# Patient Record
Sex: Female | Born: 1950 | Race: White | Hispanic: No | Smoking: Current every day smoker
Health system: Southern US, Community
[De-identification: ages and names within clinical notes are randomized; demographics above are authoritative.]

## PROBLEM LIST (undated history)

## (undated) DIAGNOSIS — M810 Age-related osteoporosis without current pathological fracture: Secondary | ICD-10-CM

## (undated) DIAGNOSIS — Z8719 Personal history of other diseases of the digestive system: Secondary | ICD-10-CM

## (undated) DIAGNOSIS — T7840XA Allergy, unspecified, initial encounter: Secondary | ICD-10-CM

## (undated) DIAGNOSIS — R87629 Unspecified abnormal cytological findings in specimens from vagina: Secondary | ICD-10-CM

## (undated) DIAGNOSIS — D126 Benign neoplasm of colon, unspecified: Secondary | ICD-10-CM

## (undated) HISTORY — DX: Unspecified abnormal cytological findings in specimens from vagina: R87.629

## (undated) HISTORY — DX: Age-related osteoporosis without current pathological fracture: M81.0

## (undated) HISTORY — PX: CERVIX SURGERY: SHX593

## (undated) HISTORY — PX: KNEE SURGERY: SHX244

## (undated) HISTORY — DX: Personal history of other diseases of the digestive system: Z87.19

## (undated) HISTORY — DX: Allergy, unspecified, initial encounter: T78.40XA

---

## 1979-08-15 DIAGNOSIS — R87629 Unspecified abnormal cytological findings in specimens from vagina: Secondary | ICD-10-CM

## 1979-08-15 HISTORY — DX: Unspecified abnormal cytological findings in specimens from vagina: R87.629

## 1998-08-07 ENCOUNTER — Other Ambulatory Visit: Admission: RE | Admit: 1998-08-07 | Discharge: 1998-08-07 | Payer: Self-pay | Admitting: *Deleted

## 1999-09-24 ENCOUNTER — Other Ambulatory Visit: Admission: RE | Admit: 1999-09-24 | Discharge: 1999-09-24 | Payer: Self-pay | Admitting: *Deleted

## 2001-08-29 ENCOUNTER — Other Ambulatory Visit: Admission: RE | Admit: 2001-08-29 | Discharge: 2001-08-29 | Payer: Self-pay | Admitting: *Deleted

## 2006-06-10 ENCOUNTER — Ambulatory Visit: Payer: Self-pay | Admitting: Gastroenterology

## 2010-07-23 ENCOUNTER — Ambulatory Visit: Payer: Self-pay | Admitting: Family Medicine

## 2014-12-25 ENCOUNTER — Ambulatory Visit: Payer: Self-pay | Admitting: Family Medicine

## 2015-07-15 DIAGNOSIS — M810 Age-related osteoporosis without current pathological fracture: Secondary | ICD-10-CM | POA: Insufficient documentation

## 2015-07-15 DIAGNOSIS — T7840XA Allergy, unspecified, initial encounter: Secondary | ICD-10-CM | POA: Insufficient documentation

## 2015-07-16 ENCOUNTER — Ambulatory Visit (INDEPENDENT_AMBULATORY_CARE_PROVIDER_SITE_OTHER): Payer: BC Managed Care – PPO | Admitting: Unknown Physician Specialty

## 2015-07-16 ENCOUNTER — Encounter: Payer: Self-pay | Admitting: Unknown Physician Specialty

## 2015-07-16 VITALS — BP 100/67 | HR 76 | Temp 98.6°F | Ht 66.1 in | Wt 194.0 lb

## 2015-07-16 DIAGNOSIS — M7581 Other shoulder lesions, right shoulder: Secondary | ICD-10-CM | POA: Diagnosis not present

## 2015-07-16 NOTE — Progress Notes (Signed)
Pleasant 64 year old female presents with right shoulder pain over last three weeks. Patient reports repetitive overhead motion while painting her deck. Pain with motion began shortly following. She reports pain at 2 on 0-10 scale. Denies treating pain except for occasional Advil with minimal relief. Fell 4 months ago on outstretched right arm and wonder if it's related.  BP 100/67 mmHg  Pulse 76  Temp(Src) 98.6 F (37 C)  Ht 5' 6.1" (1.679 m)  Wt 194 lb (87.998 kg)  BMI 31.22 kg/m2  SpO2 98%   Subjective:    Patient ID: Courtney Mills, female    DOB: June 15, 1951, 64 y.o.   MRN: [id number]  HPI: Courtney Mills is a 64 y.o. female  Chief Complaint  Patient presents with  . Arm Pain    right arm, fall back in March    Relevant past medical, surgical, family and social Shoulder Pain  The pain is present in the right shoulder. This is a recurrent problem. The current episode started 1 to 4 weeks ago. There has been no history of extremity trauma. The problem occurs constantly. The problem has been gradually worsening. The quality of the pain is described as aching. The pain is at a severity of 2/10. The pain is mild. Associated symptoms include a limited range of motion. The symptoms are aggravated by activity. She has tried OTC ointments, OTC pain meds and rest for the symptoms. The treatment provided mild relief.    history reviewed and updated as indicated. Interim medical history since our last visit reviewed. Allergies and medications reviewed and updated.    Review of Systems  Constitutional: Negative.   Respiratory: Negative.  Negative for cough, chest tightness, shortness of breath, wheezing and stridor.   Cardiovascular: Negative.  Negative for palpitations and leg swelling.  Skin: Negative.  Negative for color change, pallor, rash and wound.  Neurological: Negative.  Negative for dizziness.    Per HPI unless specifically indicated above     Objective:    BP 100/67  mmHg  Pulse 76  Temp(Src) 98.6 F (37 C)  Ht 5' 6.1" (1.679 m)  Wt 194 lb (87.998 kg)  BMI 31.22 kg/m2  SpO2 98%  Wt Readings from Last 3 Encounters:  07/16/15 194 lb (87.998 kg)  09/24/14 204 lb (92.534 kg)    Physical Exam  Constitutional: She is oriented to person, place, and time. She appears well-developed and well-nourished.  HENT:  Head: Normocephalic and atraumatic.  Neck: Normal range of motion.  Cardiovascular: Normal rate, regular rhythm and normal heart sounds.  Exam reveals no gallop and no friction rub.   No murmur heard. Pulmonary/Chest: Effort normal and breath sounds normal. No respiratory distress. She has no wheezes. She has no rales. She exhibits no tenderness.  Musculoskeletal:       Right shoulder: She exhibits decreased range of motion and pain.       Arms: Neurological: She is alert and oriented to person, place, and time.  Skin: Skin is warm.  Psychiatric: She has a normal mood and affect. Her behavior is normal. Judgment and thought content normal.    No results found for this or any previous visit.    Assessment & Plan:   Problem List Items Addressed This Visit    None    Visit Diagnoses    Rotator cuff tendonitis, right    -  Primary    Relevant Orders    Ambulatory referral to Physical Therapy  Continue OTC Advil.  Declined prescription medications.    Follow up plan: Patient referred to physical therapy and follow up PRN.

## 2015-08-14 ENCOUNTER — Ambulatory Visit (INDEPENDENT_AMBULATORY_CARE_PROVIDER_SITE_OTHER): Payer: BC Managed Care – PPO | Admitting: Unknown Physician Specialty

## 2015-08-14 ENCOUNTER — Encounter: Payer: Self-pay | Admitting: Unknown Physician Specialty

## 2015-08-14 VITALS — BP 109/72 | HR 85 | Temp 98.2°F | Ht 66.1 in | Wt 193.0 lb

## 2015-08-14 DIAGNOSIS — M7551 Bursitis of right shoulder: Secondary | ICD-10-CM | POA: Diagnosis not present

## 2015-08-14 MED ORDER — TRAMADOL HCL 50 MG PO TABS: 50.0000 mg | ORAL_TABLET | Freq: Three times a day (TID) | ORAL | Status: AC | PRN

## 2015-08-14 MED ORDER — CYCLOBENZAPRINE HCL 10 MG PO TABS: 10.0000 mg | ORAL_TABLET | Freq: Three times a day (TID) | ORAL | Status: AC | PRN

## 2015-08-14 NOTE — Progress Notes (Signed)
   BP 109/72 mmHg  Pulse 85  Temp(Src) 98.2 F (36.8 C)  Ht 5' 6.1" (1.679 m)  Wt 193 lb (87.544 kg)  BMI 31.05 kg/m2  SpO2 98%   Subjective:    Patient ID: Courtney Mills, female    DOB: 01-28-1951, 64 y.o.   MRN: [id number]  HPI: Courtney Mills is a 64 y.o. female   Right Shoulder Pain Presents for follow-up of right shoulder and elbow pain onset 5 weeks ago. Pain has progressed to entire right arm. She denies any history of injury. The quality of the pain is occasional throbbing at times and a constant ache. The pain is at a 2/10.  Aggravating factors include activity.  Relieving factors hot shower and heating pads. Treating pain with occasional 800 mg Advil q8hrs with minimal relief.  Completed physical therapy 3 days ago pain is worsening with limited range of motion.  Tension Headache Presents with c/o headache onset 2 days ago.  Constant moderate bilateral throbbing pain with occasional nausea.  Nothing relieves the symptoms.  Pertinent negatives denies photophobia, confusion, or fever.     Chief Complaint  Patient presents with  . Arm Pain    pt states she sprained her right wrist back in March, but is better now. Right shoulder and elbow started hurting recently. States muscles are very tense    Relevant past medical, surgical, family and social history reviewed and updated as indicated. Interim medical history since our last visit reviewed. Allergies and medications reviewed and updated.  Review of Systems  Constitutional: Negative for chills, activity change and appetite change.  Eyes: Negative for photophobia and visual disturbance.  Respiratory: Negative for chest tightness and shortness of breath.   Cardiovascular: Negative for chest pain, palpitations and leg swelling.  Neurological: Positive for headaches. Negative for dizziness, tremors, syncope, speech difficulty and light-headedness.       Numbness and tingling right 3rd, 4th, 5th digits of hand    Per  HPI unless specifically indicated above     Objective:    BP 109/72 mmHg  Pulse 85  Temp(Src) 98.2 F (36.8 C)  Ht 5' 6.1" (1.679 m)  Wt 193 lb (87.544 kg)  BMI 31.05 kg/m2  SpO2 98%  Wt Readings from Last 3 Encounters:  08/14/15 193 lb (87.544 kg)  07/16/15 194 lb (87.998 kg)  09/24/14 204 lb (92.534 kg)    Physical Exam  Constitutional: She appears well-nourished. No distress.  HENT:  Head: Normocephalic and atraumatic.  Right Ear: External ear normal.  Left Ear: External ear normal.  Nose: Nose normal.  Mouth/Throat: Oropharynx is clear and moist.  Eyes: Right eye exhibits no discharge. Left eye exhibits no discharge. No scleral icterus.  Neck: Normal range of motion.  Cardiovascular: Normal rate, regular rhythm and normal heart sounds.   Pulmonary/Chest: Effort normal and breath sounds normal.  Musculoskeletal:       Right shoulder: She exhibits decreased range of motion, tenderness and bony tenderness. She exhibits no swelling, no effusion and no crepitus.  Skin: Skin is warm, dry and intact. She is not diaphoretic.    No results found for this or any previous visit.    Assessment & Plan:    Shoulder bursitis Component to muscle spasm plus inpingement syndrome.  Sinc pt is BC/BS unable to inject shoulder.  Will refer to Orthopedics.  Rx for Flexeril and Tramadol from this office.    Follow up plan: With Orthopedics.

## 2015-08-14 NOTE — Assessment & Plan Note (Signed)
Orthopedic Referral Prescribed Cyclobenazaprine 10 mg tablet 3 times daily as needed for muscle spasms Prescribed Tramadol 50 mg tablet q8hrs as needed for pain

## 2015-09-04 ENCOUNTER — Other Ambulatory Visit: Payer: Self-pay | Admitting: Orthopedic Surgery

## 2015-09-04 DIAGNOSIS — M25511 Pain in right shoulder: Secondary | ICD-10-CM

## 2015-09-10 ENCOUNTER — Other Ambulatory Visit: Payer: Self-pay | Admitting: Unknown Physician Specialty

## 2015-09-10 ENCOUNTER — Ambulatory Visit
Admission: RE | Admit: 2015-09-10 | Discharge: 2015-09-10 | Disposition: A | Discharge status: Home / Self Care | Payer: BC Managed Care – PPO | Source: Ambulatory Visit | Attending: Orthopedic Surgery | Admitting: Orthopedic Surgery

## 2015-09-10 DIAGNOSIS — M25511 Pain in right shoulder: Secondary | ICD-10-CM | POA: Diagnosis present

## 2015-09-10 DIAGNOSIS — M7591 Shoulder lesion, unspecified, right shoulder: Secondary | ICD-10-CM | POA: Diagnosis not present

## 2016-02-02 IMAGING — MR MR SHOULDER*R* W/CM
5 series · 40 of 40 positions shown · IV contrast (agent unspecified)
Comparison: None.

CLINICAL DATA: Fell in February 2015. Persistent shoulder pain since
then.

EXAM:
MR ARTHROGRAM OF THE right SHOULDER
TECHNIQUE: Multiplanar, multisequence MR imaging of the right shoulder was
performed following the administration of intra-articular contrast.
CONTRAST:  See Injection Documentation.

[Series 3: T1 fat-sat · axial · 4.0mm · 0.47mm/px · z∈[-25,+72]mm · 8 of 23 slices shown (1 of 3)]
[im 1/23]
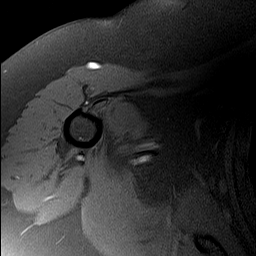
[im 4/23]
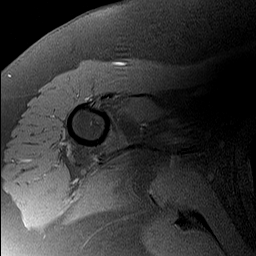
[im 7/23]
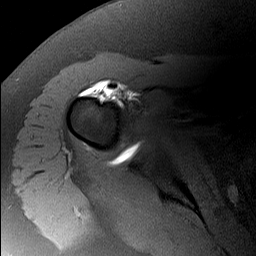
[im 10/23]
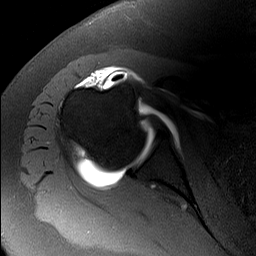
[im 13/23]
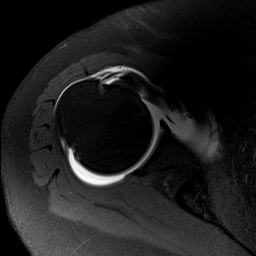
[im 16/23]
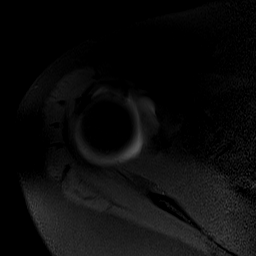
[im 19/23]
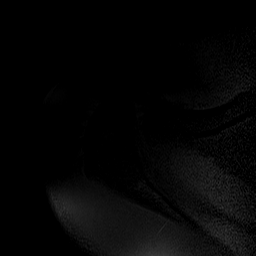
[im 23/23]
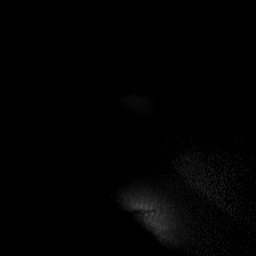

[Series 4: T1 fat-sat · oblique · 4.0mm · 0.62mm/px · 8 of 21 slices shown (2 of 3)]
[im 1/21]
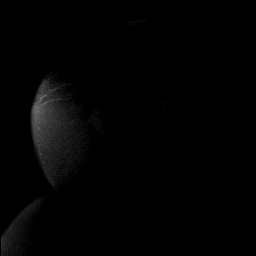
[im 3/21]
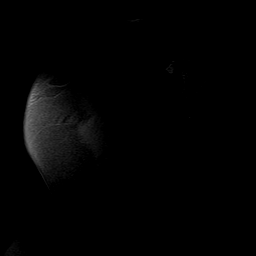
[im 6/21]
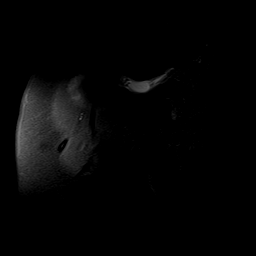
[im 9/21]
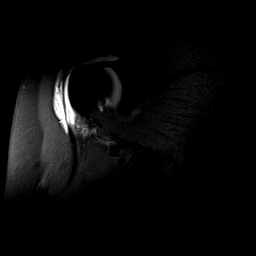
[im 12/21]
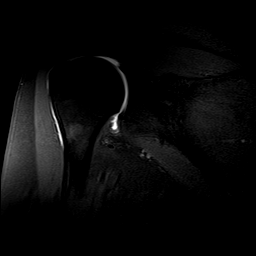
[im 15/21]
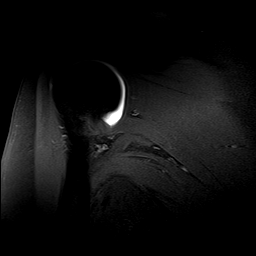
[im 18/21]
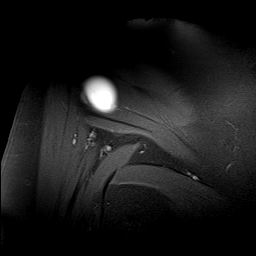
[im 21/21]
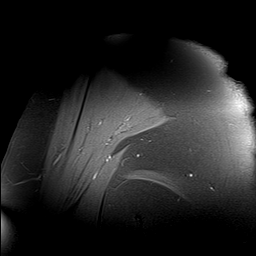

[Series 5: T2 fat-sat · oblique · 4.0mm · 0.62mm/px · 8 of 21 slices shown (1 of 2)]
[im 1/21]
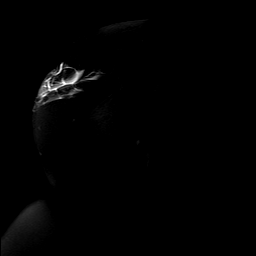
[im 3/21]
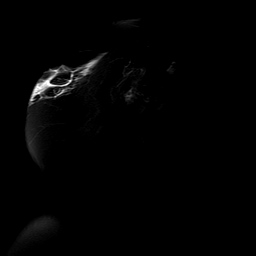
[im 6/21]
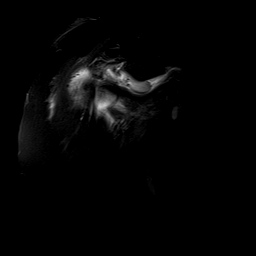
[im 9/21]
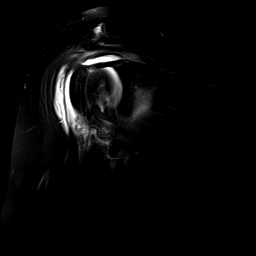
[im 12/21]
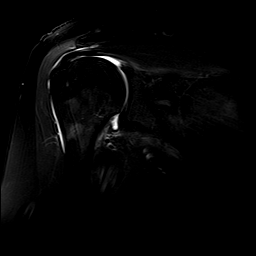
[im 15/21]
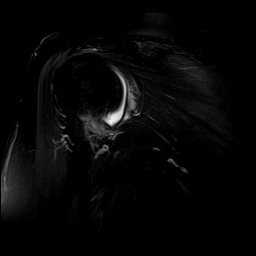
[im 18/21]
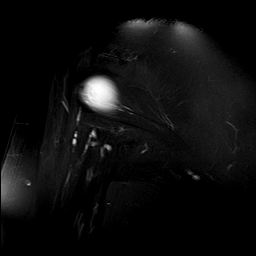
[im 21/21]
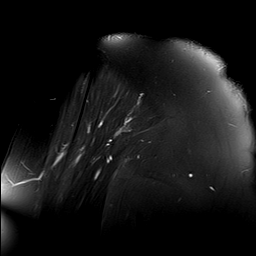

[Series 6: T1 fat-sat · oblique · non-contrast · 4.0mm · 0.42mm/px · 8 of 21 slices shown (3 of 3)]
[im 1/21]
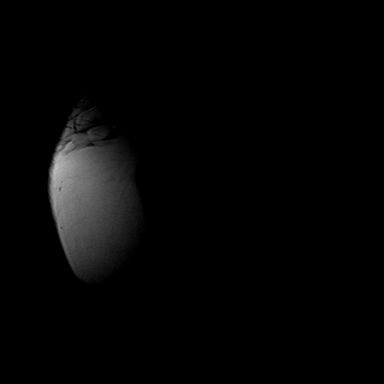
[im 3/21]
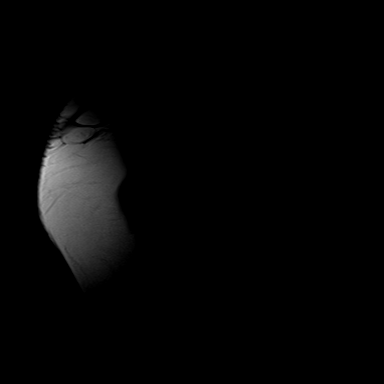
[im 6/21]
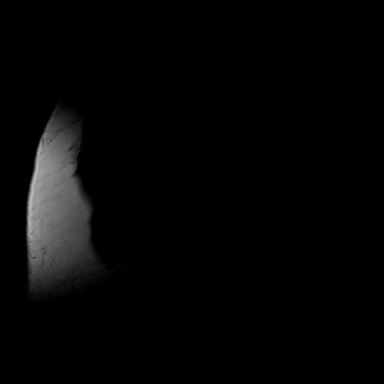
[im 9/21]
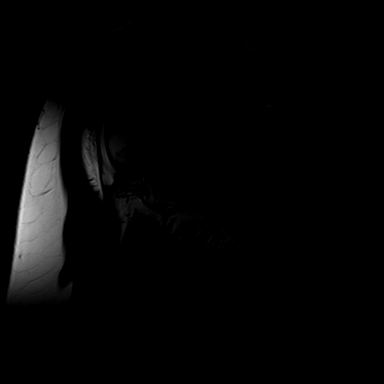
[im 12/21]
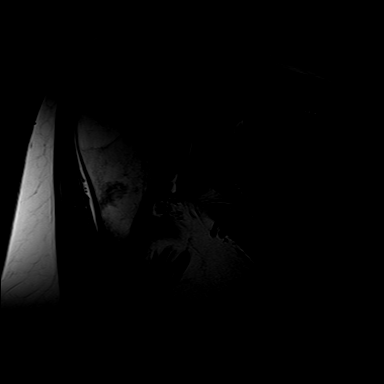
[im 15/21]
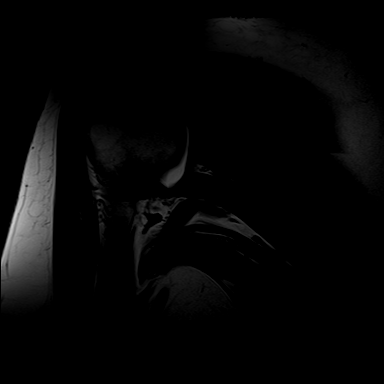
[im 18/21]
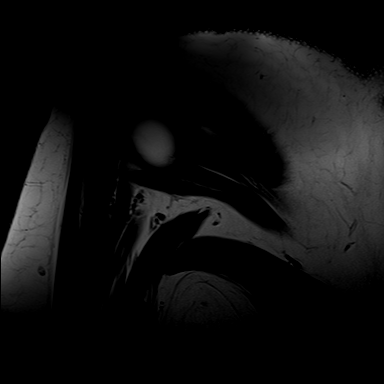
[im 21/21]
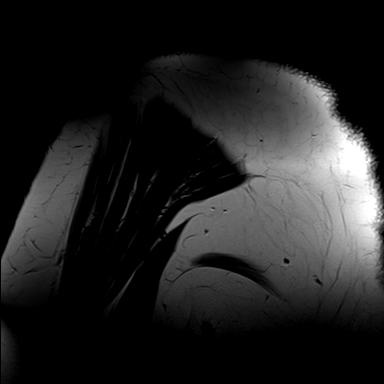

[Series 7: T2 fat-sat · oblique · 4.0mm · 0.62mm/px · 8 of 23 slices shown (2 of 2)]
[im 1/23]
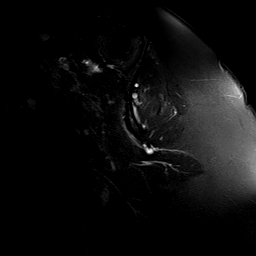
[im 4/23]
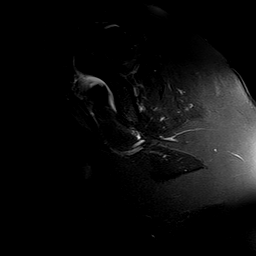
[im 7/23]
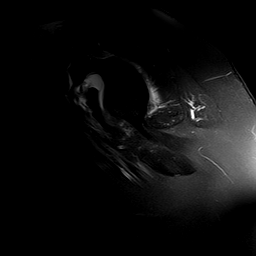
[im 10/23]
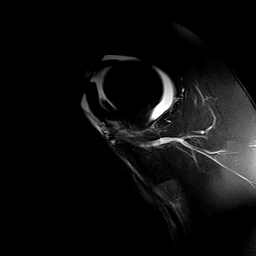
[im 13/23]
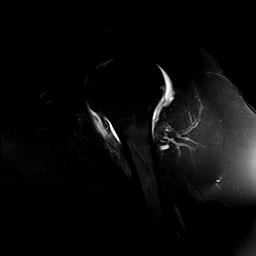
[im 16/23]
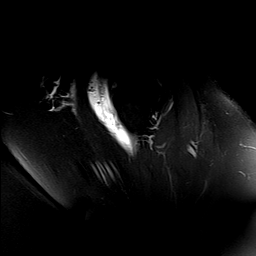
[im 19/23]
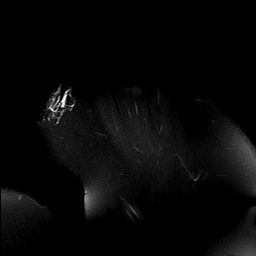
[im 23/23]
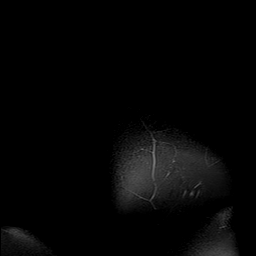

[40 of 40 positions shown; findings below may reference images not displayed]

FINDINGS: Rotator cuff: Mild rotator cuff tendinopathy/ tendinosis. No partial
or full-thickness rotator cuff tear.

Muscles: Normal.

Biceps long head: Intact.

Acromioclavicular Joint: Mild AC joint degenerative changes. The
acromion is type 2 in shape. No lateral downsloping or undersurface
spurring.

Glenohumeral Joint: No significant degenerative changes or
synovitis.

Labrum: The glenoid labrum a and glenohumeral ligaments are intact.

Bones: No acute bony findings.
IMPRESSION: 1. Mild rotator cuff tendinopathy/tendinosis. No partial or
full-thickness tear.
2. Intact long head biceps tendon and glenoid labrum.
3. No significant findings for bony impingement.

## 2016-02-02 IMAGING — RF DG ARTHROGRAM SHOULDER*R*
1 series · 3 of 3 positions shown · IV contrast (gadolinium)
Comparison: None

CLINICAL DATA: Two months of shoulder pain with limited range of
motion

EXAM:
ARTHROGRAM OF THE RIGHT SHOULDER
FLUOROSCOPY TIME:  Fluoroscopy Time (in minutes and seconds): 0
minutes, 42 seconds
Number of Acquired Images:  1
TECHNIQUE: The anticipated procedure was discussed with Ms. Soloparaclientes. She voiced
her willingness to proceed. A time-out procedure was called. There
was no known contraindication to the use of cutaneous antiseptics,
injectable lidocaine, or the iodinated and gadolinium based contrast
agents. Risks including bleeding, infection, and increased pain were
discussed and their management outlined.

[Series 1: cp_standard · 0.34mm/px · 3 of 8 frames shown]
[frame 2/8]
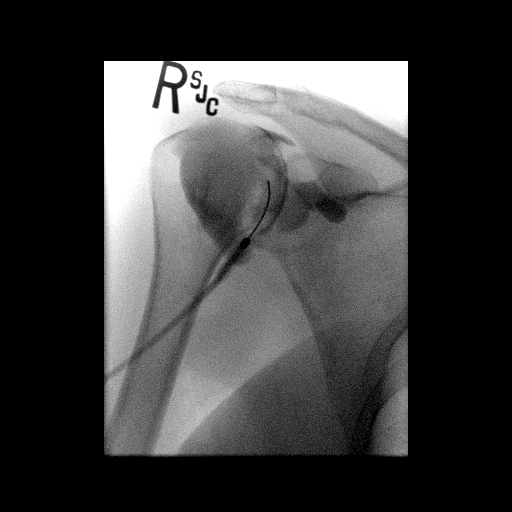
[frame 5/8]
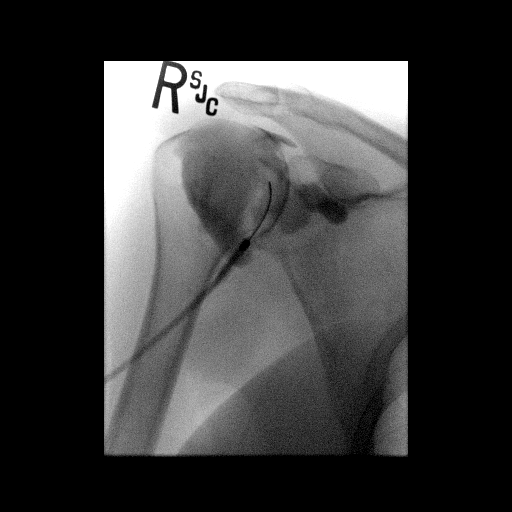
[frame 7/8]
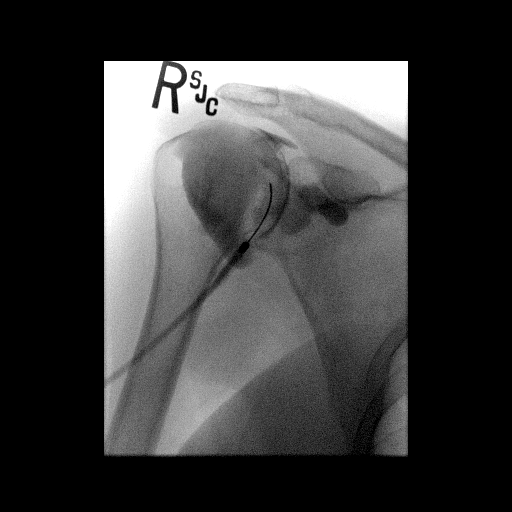

[3 of 3 positions shown; findings below may reference images not displayed]

The skin was cleansed with the antiseptic solution and the
subcutaneous tissues infiltrated with 3 cc of 1% lidocaine.
Subsequently a 22 gauge spinal needle was placed into the anterior
medial aspect of the glenohumeral joint space. A test injection was
made and subsequently a total of approximately 12 cc of a solution
of MultiHance 6.0cc, Omnipaque 200 10 cc, 1% lidocaine 3 cc, and
normal saline 15 cc was infiltrated. The needle was then withdrawn.
The patient had a moderate amount of pain during and immediately
after the procedure which then subsided.
FINDINGS: Successful injection of the the right glenohumeral joint prior to
MRI.
IMPRESSION: Successful injection of the right glenohumeral joint prior to MRI.

## 2017-04-15 ENCOUNTER — Encounter: Payer: Self-pay | Admitting: *Deleted

## 2017-04-16 ENCOUNTER — Encounter
Admission: RE | Disposition: A | Discharge status: Home / Self Care | Payer: Self-pay | Source: Ambulatory Visit | Attending: Gastroenterology

## 2017-04-16 ENCOUNTER — Ambulatory Visit: Payer: BC Managed Care – PPO | Admitting: Certified Registered Nurse Anesthetist

## 2017-04-16 ENCOUNTER — Encounter: Payer: Self-pay | Admitting: Certified Registered Nurse Anesthetist

## 2017-04-16 ENCOUNTER — Ambulatory Visit
Admission: RE | Admit: 2017-04-16 | Discharge: 2017-04-16 | Disposition: A | Discharge status: Home / Self Care | Payer: BC Managed Care – PPO | Source: Ambulatory Visit | Attending: Gastroenterology | Admitting: Gastroenterology

## 2017-04-16 DIAGNOSIS — K5730 Diverticulosis of large intestine without perforation or abscess without bleeding: Secondary | ICD-10-CM | POA: Insufficient documentation

## 2017-04-16 DIAGNOSIS — Z880 Allergy status to penicillin: Secondary | ICD-10-CM | POA: Diagnosis not present

## 2017-04-16 DIAGNOSIS — D120 Benign neoplasm of cecum: Secondary | ICD-10-CM | POA: Diagnosis not present

## 2017-04-16 DIAGNOSIS — K562 Volvulus: Secondary | ICD-10-CM | POA: Insufficient documentation

## 2017-04-16 DIAGNOSIS — M1990 Unspecified osteoarthritis, unspecified site: Secondary | ICD-10-CM | POA: Insufficient documentation

## 2017-04-16 DIAGNOSIS — D123 Benign neoplasm of transverse colon: Secondary | ICD-10-CM | POA: Insufficient documentation

## 2017-04-16 DIAGNOSIS — F17200 Nicotine dependence, unspecified, uncomplicated: Secondary | ICD-10-CM | POA: Insufficient documentation

## 2017-04-16 DIAGNOSIS — M810 Age-related osteoporosis without current pathological fracture: Secondary | ICD-10-CM | POA: Insufficient documentation

## 2017-04-16 DIAGNOSIS — Z800 Family history of malignant neoplasm of digestive organs: Secondary | ICD-10-CM | POA: Diagnosis not present

## 2017-04-16 DIAGNOSIS — D122 Benign neoplasm of ascending colon: Secondary | ICD-10-CM | POA: Diagnosis not present

## 2017-04-16 DIAGNOSIS — Z79899 Other long term (current) drug therapy: Secondary | ICD-10-CM | POA: Diagnosis not present

## 2017-04-16 DIAGNOSIS — Z1211 Encounter for screening for malignant neoplasm of colon: Secondary | ICD-10-CM | POA: Diagnosis not present

## 2017-04-16 HISTORY — PX: COLONOSCOPY WITH PROPOFOL: SHX5780

## 2017-04-16 SURGERY — COLONOSCOPY WITH PROPOFOL
Anesthesia: General

## 2017-04-16 MED ORDER — MIDAZOLAM HCL 2 MG/2ML IJ SOLN
INTRAMUSCULAR | Status: AC
  Filled 2017-04-16: qty 2

## 2017-04-16 MED ORDER — LIDOCAINE HCL (CARDIAC) 20 MG/ML IV SOLN
INTRAVENOUS | Status: DC | PRN
  Administered 2017-04-16: 60 mg via INTRAVENOUS

## 2017-04-16 MED ORDER — PHENYLEPHRINE HCL 10 MG/ML IJ SOLN
INTRAMUSCULAR | Status: DC | PRN
  Administered 2017-04-16: 200 ug via INTRAVENOUS
  Administered 2017-04-16: 100 ug via INTRAVENOUS

## 2017-04-16 MED ORDER — SODIUM CHLORIDE 0.9 % IV SOLN
INTRAVENOUS | Status: DC
  Administered 2017-04-16: 12:00:00 via INTRAVENOUS

## 2017-04-16 MED ORDER — PROPOFOL 500 MG/50ML IV EMUL
INTRAVENOUS | Status: DC | PRN
  Administered 2017-04-16: 140 ug/kg/min via INTRAVENOUS

## 2017-04-16 MED ORDER — LIDOCAINE HCL 2 % IJ SOLN
INTRAMUSCULAR | Status: AC
  Filled 2017-04-16: qty 10

## 2017-04-16 MED ORDER — PROPOFOL 500 MG/50ML IV EMUL
INTRAVENOUS | Status: AC
  Filled 2017-04-16: qty 50

## 2017-04-16 MED ORDER — SODIUM CHLORIDE 0.9 % IV SOLN: INTRAVENOUS | Status: DC

## 2017-04-16 MED ORDER — MIDAZOLAM HCL 2 MG/2ML IJ SOLN
INTRAMUSCULAR | Status: DC | PRN
  Administered 2017-04-16: 2 mg via INTRAVENOUS

## 2017-04-16 MED ORDER — PROPOFOL 10 MG/ML IV BOLUS
INTRAVENOUS | Status: DC | PRN
  Administered 2017-04-16: 30 mg via INTRAVENOUS

## 2017-04-16 MED ORDER — PROPOFOL 10 MG/ML IV BOLUS
INTRAVENOUS | Status: AC
  Filled 2017-04-16: qty 20

## 2017-04-16 MED ORDER — SODIUM CHLORIDE 0.9 % IJ SOLN
INTRAMUSCULAR | Status: DC | PRN
  Administered 2017-04-16: 10 mL

## 2017-04-16 NOTE — Anesthesia Preprocedure Evaluation (Signed)
Anesthesia Evaluation  Patient identified by MRN, date of birth, ID band Patient awake    Reviewed: Allergy & Precautions, NPO status , Patient's Chart, lab work & pertinent test results  Airway Mallampati: III  TM Distance: <3 FB     Dental  (+) Caps, Dental Advisory Given   Pulmonary Current Smoker,    Pulmonary exam normal        Cardiovascular negative cardio ROS Normal cardiovascular exam     Neuro/Psych negative neurological ROS  negative psych ROS   GI/Hepatic negative GI ROS, Neg liver ROS,   Endo/Other  negative endocrine ROS  Renal/GU negative Renal ROS  negative genitourinary   Musculoskeletal  (+) Arthritis , Osteoarthritis,    Abdominal Normal abdominal exam  (+)   Peds negative pediatric ROS (+)  Hematology negative hematology ROS (+)   Anesthesia Other Findings   Reproductive/Obstetrics                             Anesthesia Physical Anesthesia Plan  ASA: II  Anesthesia Plan: General   Post-op Pain Management:    Induction: Intravenous  Airway Management Planned: Nasal Cannula  Additional Equipment:   Intra-op Plan:   Post-operative Plan:   Informed Consent:   Dental advisory given  Plan Discussed with: CRNA and Surgeon  Anesthesia Plan Comments:         Anesthesia Quick Evaluation

## 2017-04-16 NOTE — H&P (Signed)
Outpatient short stay form Pre-procedure 04/16/2017 1:09 PM Lollie Sails MD  Primary Physician: Garen Grams NP  Reason for visit:  Colonoscopy  History of present illness:  Patient is a 66 year old female presenting today for a screening colonoscopy. There is been over 10 years since her last procedure. She tolerated her     Current Facility-Administered Medications:  .  0.9 %  sodium chloride infusion, , Intravenous, Continuous, Lollie Sails, MD, Last Rate: 20 mL/hr at 04/16/17 1229 .  0.9 %  sodium chloride infusion, , Intravenous, Continuous, Lollie Sails, MD  Prescriptions Prior to Admission  Medication Sig Dispense Refill Last Dose  . celecoxib (CELEBREX) 100 MG capsule Take 100 mg by mouth 2 (two) times daily.   12/17/2016  . cyclobenzaprine (FLEXERIL) 10 MG tablet Take 1 tablet (10 mg total) by mouth 3 (three) times daily as needed for muscle spasms. 30 tablet 0 04/16/2017 at Unknown time  . traMADol (ULTRAM) 50 MG tablet Take 1 tablet (50 mg total) by mouth every 8 (eight) hours as needed. 30 tablet 0 12/17/2016     Allergies  Allergen Reactions  . Benadryl [Diphenhydramine] Other (See Comments)    Heart races  . Codeine   . Penicillins      Past Medical History:  Diagnosis Date  . 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 Allergy   . History of IBS   . Osteoporosis     Review of systems:      Physical Exam    Heart and lungs: Regular rate and rhythm without rub or gallop, lungs are bilaterally clear.    HEENT: Normocephalic atraumatic eyes are anicteric    Other:     Pertinant exam for procedure: Soft nontender nondistended bowel sounds positive normoactive.    Planned proceedures: Colonoscopy and indicated procedures. I have discussed the risks benefits and complications of procedures to include not limited to bleeding, infection, perforation and the risk of sedation and the patient wishes to proceed.    Lollie Sails,  MD Gastroenterology 04/16/2017  1:09 PM

## 2017-04-16 NOTE — Op Note (Signed)
[facility] Gastroenterology Patient Name: Courtney Mills Procedure Date: 04/16/2017 1:10 PM MRN: [id number] Account #: [id number] Date of Birth: [birth year] Admit Type: Outpatient Age: 66 Room: [facility] ENDO ROOM 3 Gender: Female Note Status: Finalized Procedure:            Colonoscopy Indications:          Clinically significant diarrhea of unexplained origin,                        Family history of colon cancer in a first-degree                        relative Providers:            Lollie Sails, MD Referring MD:         No Local Md, MD (Referring MD) Medicines:            Monitored Anesthesia Care Complications:        No immediate complications. Procedure:            Pre-Anesthesia Assessment:                       - ASA Grade Assessment: II - A patient with mild                        systemic disease.                       After obtaining informed consent, the colonoscope was                        passed under direct vision. Throughout the procedure,                        the patient's blood pressure, pulse, and oxygen                        saturations were monitored continuously. The                        Colonoscope was introduced through the anus and                        advanced to the the cecum, identified by appendiceal                        orifice and ileocecal valve. The quality of the bowel                        preparation was good. Findings:      The descending colon, transverse colon, hepatic flexure and ascending       colon were significantly redundant.      A 4 mm polyp was found in the hepatic flexure. The polyp was sessile.       The polyp was removed with a cold snare. Resection and retrieval were       complete.      A 40x5 mm polyp, geographic in shape, was found in the cecum. The polyp       was sessile. Hemostasis was good. The polyp was removed with a cold       snare.  The polyp was removed with a saline injection-lift  technique       using a cold snare. The polyp was removed with a piecemeal technique       using a cold snare and cold forcep. Resection and retrieval were       complete.      A 3 mm polyp was found in the ascending colon. The polyp was sessile.       The polyp was removed with a cold biopsy forceps. Resection and       retrieval were complete.      Biopsies for histology were taken with a cold forceps from the right       colon and left colon for evaluation of microscopic colitis.      Multiple small-mouthed diverticula were found in the sigmoid colon and       distal descending colon.      The digital rectal exam was normal. Impression:           - Redundant colon.                       - One 4 mm polyp at the hepatic flexure, removed with a                        cold snare. Resected and retrieved.                       - One 40 mm polyp in the cecum, removed with a cold                        snare, removed using injection-lift and a cold snare                        and removed piecemeal using a cold snare. Resected and                        retrieved.                       - One 3 mm polyp in the ascending colon, removed with a                        cold biopsy forceps. Resected and retrieved.                       - Diverticulosis in the sigmoid colon and in the distal                        descending colon.                       - Biopsies were taken with a cold forceps from the                        right colon and left colon for evaluation of                        microscopic colitis. Recommendation:       - Clear liquid diet today.                       -  Full liquid diet for 2 days, then advance as                        tolerated to soft diet for 3 days. Procedure Code(s):    --- Professional ---                       [phone], Colonoscopy, flexible; with removal of tumor(s),                        polyp(s), or other lesion(s) by snare technique                        45381, Colonoscopy, flexible; with directed submucosal                        injection(s), any substance                       44034, 6, Colonoscopy, flexible; with biopsy, single                        or multiple Diagnosis Code(s):    --- Professional ---                       D12.3, Benign neoplasm of transverse colon (hepatic                        flexure or splenic flexure)                       D12.0, Benign neoplasm of cecum                       D12.2, Benign neoplasm of ascending colon                       R19.7, Diarrhea, unspecified                       Z80.0, Family history of malignant neoplasm of                        digestive organs                       K57.30, Diverticulosis of large intestine without                        perforation or abscess without bleeding                       Q43.8, Other specified congenital malformations of                        intestine CPT copyright 2016 American Medical [employer]. All rights reserved. The codes documented in this report are preliminary and upon coder review may  be revised to meet current compliance requirements. Lollie Sails, MD 04/16/2017 2:45:55 PM This report has been signed electronically. Number of Addenda: 0 Note Initiated On: 04/16/2017 1:10 PM Scope Withdrawal Time: 0 hours 46 minutes 55 seconds  Total Procedure Duration: 1 hour 16 minutes 53 seconds       [name]  [facility]

## 2017-04-16 NOTE — Transfer of Care (Signed)
Immediate Anesthesia Transfer of Care Note  Patient: Courtney Mills  Procedure(s) Performed: Procedure(s): COLONOSCOPY WITH PROPOFOL (N/A)  Patient Location: PACU  Anesthesia Type:General  Level of Consciousness: sedated  Airway & Oxygen Therapy: Patient Spontanous Breathing and Patient connected to nasal cannula oxygen  Post-op Assessment: Report given to RN and Post -op Vital signs reviewed and stable  Post vital signs: Reviewed and stable  Last Vitals:  Vitals:   04/16/17 1211 04/16/17 1440  BP: 103/63 (!) 105/93  Pulse: 73 74  Resp: 16 (!) 24  Temp: (!) 36.1 C 36.4 C    Last Pain:  Vitals:   04/16/17 1440  TempSrc: Tympanic  PainSc: Asleep         Complications: No apparent anesthesia complications

## 2017-04-16 NOTE — Anesthesia Post-op Follow-up Note (Cosign Needed)
Anesthesia QCDR form completed.        

## 2017-04-16 NOTE — Anesthesia Postprocedure Evaluation (Signed)
Anesthesia Post Note  Patient: Courtney Mills  Procedure(s) Performed: Procedure(s) (LRB): COLONOSCOPY WITH PROPOFOL (N/A)  Patient location during evaluation: Endoscopy Anesthesia Type: General Level of consciousness: awake and alert Pain management: pain level controlled Vital Signs Assessment: post-procedure vital signs reviewed and stable Respiratory status: spontaneous breathing, nonlabored ventilation, respiratory function stable and patient connected to nasal cannula oxygen Cardiovascular status: blood pressure returned to baseline and stable Postop Assessment: no signs of nausea or vomiting Anesthetic complications: no     Last Vitals:  Vitals:   04/16/17 1500 04/16/17 1510  BP: 92/68 100/68  Pulse: 61 61  Resp: 18 (!) 22  Temp:      Last Pain:  Vitals:   04/16/17 1440  TempSrc: Tympanic  PainSc: Asleep                 Precious Haws Piscitello

## 2017-04-16 NOTE — Anesthesia Procedure Notes (Signed)
Date/Time: 04/16/2017 1:09 PM Performed by: Johnna Acosta Pre-anesthesia Checklist: Patient identified, Emergency Drugs available, Suction available, Patient being monitored and Timeout performed Patient Re-evaluated:Patient Re-evaluated prior to inductionOxygen Delivery Method: Nasal cannula

## 2017-04-19 ENCOUNTER — Encounter: Payer: Self-pay | Admitting: Gastroenterology

## 2017-04-20 LAB — SURGICAL PATHOLOGY

## 2021-10-08 ENCOUNTER — Encounter: Payer: Self-pay | Admitting: *Deleted

## 2021-10-09 ENCOUNTER — Encounter: Payer: Self-pay | Admitting: *Deleted

## 2021-10-09 ENCOUNTER — Ambulatory Visit: Payer: Medicare PPO | Admitting: Anesthesiology

## 2021-10-09 ENCOUNTER — Encounter
Admission: RE | Disposition: A | Discharge status: Home / Self Care | Payer: Self-pay | Source: Home / Self Care | Attending: Gastroenterology

## 2021-10-09 ENCOUNTER — Ambulatory Visit
Admission: RE | Admit: 2021-10-09 | Discharge: 2021-10-09 | Disposition: A | Discharge status: Home / Self Care | Payer: Medicare PPO | Attending: Gastroenterology | Admitting: Gastroenterology

## 2021-10-09 ENCOUNTER — Other Ambulatory Visit: Payer: Self-pay

## 2021-10-09 DIAGNOSIS — D128 Benign neoplasm of rectum: Secondary | ICD-10-CM | POA: Diagnosis not present

## 2021-10-09 DIAGNOSIS — Z888 Allergy status to other drugs, medicaments and biological substances status: Secondary | ICD-10-CM | POA: Diagnosis not present

## 2021-10-09 DIAGNOSIS — Z09 Encounter for follow-up examination after completed treatment for conditions other than malignant neoplasm: Secondary | ICD-10-CM | POA: Diagnosis present

## 2021-10-09 DIAGNOSIS — D120 Benign neoplasm of cecum: Secondary | ICD-10-CM | POA: Insufficient documentation

## 2021-10-09 DIAGNOSIS — F17200 Nicotine dependence, unspecified, uncomplicated: Secondary | ICD-10-CM | POA: Insufficient documentation

## 2021-10-09 DIAGNOSIS — Z880 Allergy status to penicillin: Secondary | ICD-10-CM | POA: Diagnosis not present

## 2021-10-09 DIAGNOSIS — Z885 Allergy status to narcotic agent status: Secondary | ICD-10-CM | POA: Diagnosis not present

## 2021-10-09 DIAGNOSIS — Q438 Other specified congenital malformations of intestine: Secondary | ICD-10-CM | POA: Diagnosis not present

## 2021-10-09 HISTORY — DX: Benign neoplasm of colon, unspecified: D12.6

## 2021-10-09 HISTORY — PX: COLONOSCOPY: SHX5424

## 2021-10-09 SURGERY — COLONOSCOPY
Anesthesia: General

## 2021-10-09 MED ORDER — EPHEDRINE SULFATE 50 MG/ML IJ SOLN
INTRAMUSCULAR | Status: DC | PRN
  Administered 2021-10-09: 10 mg via INTRAVENOUS

## 2021-10-09 MED ORDER — SODIUM CHLORIDE 0.9 % IV SOLN: INTRAVENOUS | Status: DC

## 2021-10-09 MED ORDER — PROPOFOL 500 MG/50ML IV EMUL
INTRAVENOUS | Status: DC | PRN
  Administered 2021-10-09: 120 ug/kg/min via INTRAVENOUS

## 2021-10-09 MED ORDER — PROPOFOL 10 MG/ML IV BOLUS
INTRAVENOUS | Status: AC
Start: 2021-10-09 — End: ?
  Filled 2021-10-09: qty 20

## 2021-10-09 MED ORDER — PHENYLEPHRINE HCL (PRESSORS) 10 MG/ML IV SOLN
INTRAVENOUS | Status: DC | PRN
  Administered 2021-10-09: 100 ug via INTRAVENOUS

## 2021-10-09 MED ORDER — PROPOFOL 10 MG/ML IV BOLUS
INTRAVENOUS | Status: DC | PRN
  Administered 2021-10-09 (×3): 20 mg via INTRAVENOUS
  Administered 2021-10-09: 60 mg via INTRAVENOUS

## 2021-10-09 MED ORDER — FENTANYL CITRATE (PF) 100 MCG/2ML IJ SOLN
INTRAMUSCULAR | Status: DC | PRN
  Administered 2021-10-09 (×4): 25 ug via INTRAVENOUS

## 2021-10-09 MED ORDER — PROPOFOL 500 MG/50ML IV EMUL
INTRAVENOUS | Status: AC
  Filled 2021-10-09: qty 50

## 2021-10-09 MED ORDER — FENTANYL CITRATE (PF) 100 MCG/2ML IJ SOLN
INTRAMUSCULAR | Status: AC
  Filled 2021-10-09: qty 2

## 2021-10-09 NOTE — Op Note (Signed)
[facility] Gastroenterology Patient Name: Courtney Mills Procedure Date: 10/09/2021 10:58 AM MRN: [id number] Account #: [id number] Date of Birth: [birth year] Admit Type: Outpatient Age: 70 Room: [facility] ENDO ROOM 1 Gender: Female Note Status: Finalized Instrument Name: Colonoscope [id number] Procedure:             Colonoscopy Indications:           High risk colon cancer surveillance: Personal history                         of colonic polyps Providers:             Rueben Bash, DO Referring MD:          Kathrine Haddock (Referring MD) Medicines:             Monitored Anesthesia Care Complications:         No immediate complications. Estimated blood loss:                         Minimal. Procedure:             Pre-Anesthesia Assessment:                        - Prior to the procedure, a History and Physical was                         performed, and patient medications and allergies were                         reviewed. The patient is competent. The risks and                         benefits of the procedure and the sedation options and                         risks were discussed with the patient. All questions                         were answered and informed consent was obtained.                         Patient identification and proposed procedure were                         verified by the physician, the nurse, the anesthetist                         and the technician in the endoscopy suite. Mental                         Status Examination: alert and oriented. Airway                         Examination: normal oropharyngeal airway and neck                         mobility. Respiratory Examination: clear to  auscultation. CV Examination: RRR, no murmurs, no S3                         or S4. Prophylactic Antibiotics: The patient does not                         require prophylactic antibiotics. Prior                          Anticoagulants: The patient has taken no previous                         anticoagulant or antiplatelet agents. ASA Grade                         Assessment: III - A patient with severe systemic                         disease. After reviewing the risks and benefits, the                         patient was deemed in satisfactory condition to                         undergo the procedure. The anesthesia plan was to use                         monitored anesthesia care (MAC). Immediately prior to                         administration of medications, the patient was                         re-assessed for adequacy to receive sedatives. The                         heart rate, respiratory rate, oxygen saturations,                         blood pressure, adequacy of pulmonary ventilation, and                         response to care were monitored throughout the                         procedure. The physical status of the patient was                         re-assessed after the procedure.                        After obtaining informed consent, the colonoscope was                         passed under direct vision. Throughout the procedure,                         the patient's blood pressure, pulse, and oxygen  saturations were monitored continuously. The                         Colonoscope was introduced through the anus and                         advanced to the the terminal ileum, with                         identification of the appendiceal orifice and IC                         valve. The colonoscopy was somewhat difficult due to a                         redundant colon. Successful completion of the                         procedure was aided by using manual pressure,                         straightening and shortening the scope to obtain bowel                         loop reduction, using scope torsion and lavage. The                         patient tolerated  the procedure well. The quality of                         the bowel preparation was evaluated using the BBPS                         [facility] Bowel Preparation Scale) with scores of: Right                         Colon = 3, Transverse Colon = 3 and Left Colon = 3                         (entire mucosa seen well with no residual staining,                         small fragments of stool or opaque liquid). The total                         BBPS score equals 9. Findings:      The perianal and digital rectal examinations were normal. Pertinent       negatives include normal sphincter tone.      The terminal ileum appeared normal.      A 2 to 3 mm polyp was found in the rectum. The polyp was sessile. The       polyp was removed with a cold biopsy forceps. Resection and retrieval       were complete. Estimated blood loss was minimal.      A residual polypoid area was found from previous polypectomy polyp was       found in the cecum. The was sessile. The area  was islands of polypoid       mucosa. A cold snare and jump forceps were used to help remove these       polypoid areas. Due to abdominal breathing, this made this removal quite       difficult. The entire area was not able to be resected. Retrieval was       accomplished of the pieces removed.      The exam was otherwise without abnormality on direct and retroflexion       views. Impression:            - The examined portion of the ileum was normal.                        - One 2 to 3 mm polyp in the rectum, removed with a                         cold biopsy forceps. Resected and retrieved.                        - One residual polypoid area was found from previous                         polypectomy polyp in the cecum.                        - The examination was otherwise normal on direct and                         retroflexion views. Recommendation:        - Discharge patient to home.                        - Resume previous diet.                         - Continue present medications.                        - Await pathology results.                        - Repeat colonoscopy in 6 months for retreatment.                        - Return to referring physician as previously                         scheduled. Procedure Code(s):     --- Professional ---                        [phone], Colonoscopy, flexible; with biopsy, single or                         multiple Diagnosis Code(s):     --- Professional ---                        Z86.010, Personal history of colonic polyps  K62.1, Rectal polyp                        K63.5, Polyp of colon CPT copyright 2019 [employer]. All rights reserved. The codes documented in this report are preliminary and upon coder review may  be revised to meet current compliance requirements. Attending Participation:      I personally performed the entire procedure. Volney American, DO [name] DO, DO 10/09/2021 12:15:28 PM This report has been signed electronically. Number of Addenda: 0 Note Initiated On: 10/09/2021 10:58 AM Scope Withdrawal Time: 0 hours 22 minutes 24 seconds  Total Procedure Duration: 0 hours 47 minutes 22 seconds  Estimated Blood Loss:  Estimated blood loss was minimal.      [facility]

## 2021-10-09 NOTE — Transfer of Care (Signed)
Immediate Anesthesia Transfer of Care Note  Patient: Courtney Mills  Procedure(s) Performed: COLONOSCOPY  Patient Location: PACU  Anesthesia Type:MAC  Level of Consciousness: awake  Airway & Oxygen Therapy: Patient Spontanous Breathing  Post-op Assessment: Report given to RN and Post -op Vital signs reviewed and stable  Post vital signs: stable  Last Vitals:  Vitals Value Taken Time  BP 96/67 10/09/21 1210  Temp 35.9 C 10/09/21 1210  Pulse 105 10/09/21 1212  Resp 20 10/09/21 1212  SpO2 93 % 10/09/21 1212  Vitals shown include unvalidated device data.  Last Pain:  Vitals:   10/09/21 1210  TempSrc: Temporal  PainSc:          Complications: No notable events documented.

## 2021-10-09 NOTE — Interval H&P Note (Signed)
History and Physical Interval Note: Preprocedure H&P from 10/09/21  was reviewed and there was no interval change after seeing and examining the patient.  Written consent was obtained from the patient after discussion of risks, benefits, and alternatives. Patient has consented to proceed with Colonoscopy with possible intervention   10/09/2021 11:14 AM  Courtney Mills  has presented today for surgery, with the diagnosis of (Z86.010) History of colon polyps  (Z80.0) Family history of colon cancer.  The various methods of treatment have been discussed with the patient and family. After consideration of risks, benefits and other options for treatment, the patient has consented to  Procedure(s): COLONOSCOPY (N/A) as a surgical intervention.  The patient's history has been reviewed, patient examined, no change in status, stable for surgery.  I have reviewed the patient's chart and labs.  Questions were answered to the patient's satisfaction.     Courtney Mills

## 2021-10-09 NOTE — H&P (Signed)
[facility] Pre-Procedure H&P   Patient ID: Courtney Mills is a 70 y.o. female.  Gastroenterology Provider: [name], DO  Referring Provider: Laurine Blazer, PA PCP: Kathrine Haddock, NP  Date: 10/09/2021  HPI Ms. Courtney Mills is a 70 y.o. female who presents today for Colonoscopy for personal history of colon polyps and fhx of colon cancer.  2018 Colonoscopy with 4cm polyp in the cecum; pt did not return for f/u colonoscopy for evaluation Bx negative for microscopic colitis Positive tobacco use Soft bm qd, otherwise regular, no blood/melena. Appetite and weight stable. No other acute gi complaints.   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   Allergy    History of IBS    Osteoporosis    Tubular adenoma of colon     Past Surgical History:  Procedure Laterality Date   CERVIX SURGERY     COLONOSCOPY WITH PROPOFOL N/A 04/16/2017   Procedure: COLONOSCOPY WITH PROPOFOL;  Surgeon: Lollie Sails, MD;  Location: [facility] ENDOSCOPY;  Service: Endoscopy;  Laterality: N/A;   KNEE SURGERY Bilateral    Arthroscopic    Family History Mother- CRC - 39s Father- Crohns No other h/o GI disease or malignancy  Review of Systems  Constitutional:  Negative for activity change, appetite change, fatigue, fever and unexpected weight change.  HENT:  Negative for trouble swallowing and voice change.   Respiratory:  Negative for shortness of breath and wheezing.   Cardiovascular:  Negative for chest pain and palpitations.  Gastrointestinal:  Negative for abdominal distention, abdominal pain, anal bleeding, blood in stool, constipation, diarrhea, nausea, rectal pain and vomiting.  Musculoskeletal:  Negative for arthralgias and myalgias.  Skin:  Negative for color change and pallor.  Neurological:  Negative for dizziness, syncope and weakness.  Psychiatric/Behavioral:  Negative for confusion.   All other  systems reviewed and are negative.   Medications No current facility-administered medications on file prior to encounter.   Current Outpatient Medications on File Prior to Encounter  Medication Sig Dispense Refill   ascorbic acid (VITAMIN C) 1000 MG tablet Take 1,000 mg by mouth daily. (Patient not taking: Reported on 10/08/2021)     celecoxib (CELEBREX) 100 MG capsule Take 100 mg by mouth 2 (two) times daily. (Patient not taking: Reported on 10/09/2021)     cyclobenzaprine (FLEXERIL) 10 MG tablet Take 1 tablet (10 mg total) by mouth 3 (three) times daily as needed for muscle spasms. (Patient not taking: Reported on 10/09/2021) 30 tablet 0   traMADol (ULTRAM) 50 MG tablet Take 1 tablet (50 mg total) by mouth every 8 (eight) hours as needed. 30 tablet 0    Pertinent medications related to GI and procedure were reviewed by me with the patient prior to the procedure   Current Facility-Administered Medications:    0.9 %  sodium chloride infusion, , Intravenous, Continuous, [name], DO, Last Rate: 20 mL/hr at 10/09/21 1030, Continued from Pre-op at 10/09/21 1030  sodium chloride 20 mL/hr at 10/09/21 1030       Allergies  Allergen Reactions   Benadryl [Diphenhydramine] Other (See Comments)    Heart races   Codeine    Penicillins    Allergies were reviewed by me prior to the procedure  Objective    Vitals:   10/09/21 1004  BP: 113/72  Pulse: 70  Resp: 16  Temp: (!) 96.9 F (36.1 C)  TempSrc: Temporal  SpO2: 100%  Weight: 86.6  kg  Height: 5\' 6"  (1.676 m)     Physical Exam Vitals reviewed.  Constitutional:      General: She is not in acute distress.    Appearance: Normal appearance. She is not ill-appearing, toxic-appearing or diaphoretic.  HENT:     Head: Normocephalic and atraumatic.     Nose: Nose normal.     Mouth/Throat:     Mouth: Mucous membranes are moist.     Pharynx: Oropharynx is clear.  Eyes:     General: No scleral icterus.    Extraocular  Movements: Extraocular movements intact.  Cardiovascular:     Rate and Rhythm: Normal rate and regular rhythm.     Heart sounds: Normal heart sounds. No murmur heard.   No friction rub. No gallop.  Pulmonary:     Effort: Pulmonary effort is normal. No respiratory distress.     Breath sounds: Normal breath sounds. No wheezing, rhonchi or rales.  Abdominal:     General: Bowel sounds are normal. There is no distension.     Palpations: Abdomen is soft.     Tenderness: There is no abdominal tenderness. There is no guarding or rebound.  Musculoskeletal:     Cervical back: Neck supple.     Right lower leg: No edema.     Left lower leg: No edema.  Skin:    General: Skin is warm and dry.     Coloration: Skin is not jaundiced or pale.  Neurological:     General: No focal deficit present.     Mental Status: She is alert and oriented to person, place, and time. Mental status is at baseline.  Psychiatric:        Mood and Affect: Mood normal.        Behavior: Behavior normal.        Thought Content: Thought content normal.        Judgment: Judgment normal.     Assessment:  Ms. Courtney Mills is a 70 y.o. female  who presents today for Colonoscopy for personal history of colon polyps and fhx of colon cancer.  Plan:  Colonoscopy with possible intervention today Colonoscopy with possible biopsy, control of bleeding, polypectomy, and interventions as necessary has been discussed with the patient/patient representative. Informed consent was obtained from the patient/patient representative after explaining the indication, nature, and risks of the procedure including but not limited to death, bleeding, perforation, missed neoplasm/lesions, cardiorespiratory compromise, and reaction to medications. Opportunity for questions was given and appropriate answers were provided. Patient/patient representative has verbalized understanding is amenable to undergoing the procedure.   [name], DO   [facility]  Portions of the record may have been created with voice recognition software. Occasional wrong-word or 'sound-a-like' substitutions may have occurred due to the inherent limitations of voice recognition software.  Read the chart carefully and recognize, using context, where substitutions may have occurred.

## 2021-10-09 NOTE — Anesthesia Postprocedure Evaluation (Signed)
Anesthesia Post Note  Patient: Courtney Mills  Procedure(s) Performed: COLONOSCOPY  Patient location during evaluation: PACU Anesthesia Type: General Level of consciousness: awake and oriented Pain management: pain level controlled Vital Signs Assessment: post-procedure vital signs reviewed and stable Respiratory status: spontaneous breathing and respiratory function stable Cardiovascular status: blood pressure returned to baseline Anesthetic complications: no   No notable events documented.   Last Vitals:  Vitals:   10/09/21 1230 10/09/21 1240  BP: 98/60 102/69  Pulse: 83 96  Resp: 20 18  Temp:    SpO2: 98% 98%    Last Pain:  Vitals:   10/09/21 1210  TempSrc: Temporal  PainSc:                  VAN STAVEREN,Tianni Escamilla

## 2021-10-09 NOTE — Anesthesia Preprocedure Evaluation (Signed)
Anesthesia Evaluation  Patient identified by MRN, date of birth, ID band Patient awake    Reviewed: Allergy & Precautions, NPO status , Patient's Chart, lab work & pertinent test results  Airway Mallampati: II  TM Distance: >3 FB Neck ROM: full    Dental  (+) Teeth Intact   Pulmonary neg pulmonary ROS, COPD, Current Smoker and Patient abstained from smoking.,    Pulmonary exam normal  + decreased breath sounds      Cardiovascular Exercise Tolerance: Good negative cardio ROS Normal cardiovascular exam Rhythm:Regular Rate:Normal     Neuro/Psych negative neurological ROS  negative psych ROS   GI/Hepatic negative GI ROS, Neg liver ROS,   Endo/Other  negative endocrine ROS  Renal/GU negative Renal ROS  negative genitourinary   Musculoskeletal negative musculoskeletal ROS (+)   Abdominal (+) + obese,   Peds negative pediatric ROS (+)  Hematology negative hematology ROS (+)   Anesthesia Other Findings Past Medical History: 08/15/1979: Abnormal vaginal Pap smear     Comment:  frozen cervic No date: Allergy No date: History of IBS No date: Osteoporosis No date: Tubular adenoma of colon  Past Surgical History: No date: CERVIX SURGERY 04/16/2017: COLONOSCOPY WITH PROPOFOL; N/A     Comment:  Procedure: COLONOSCOPY WITH PROPOFOL;  Surgeon:               Lollie Sails, MD;  Location: [facility] ENDOSCOPY;                Service: Endoscopy;  Laterality: N/A; No date: KNEE SURGERY; Bilateral     Comment:  Arthroscopic  BMI    Body Mass Index: 30.83 kg/m      Reproductive/Obstetrics negative OB ROS                             Anesthesia Physical Anesthesia Plan  ASA: 3  Anesthesia Plan: General   Post-op Pain Management:    Induction: Intravenous  PONV Risk Score and Plan: Propofol infusion and TIVA  Airway Management Planned: Nasal Cannula  Additional Equipment:   Intra-op  Plan:   Post-operative Plan:   Informed Consent: I have reviewed the patients History and Physical, chart, labs and discussed the procedure including the risks, benefits and alternatives for the proposed anesthesia with the patient or authorized representative who has indicated his/her understanding and acceptance.     Dental Advisory Given  Plan Discussed with: Anesthesiologist, CRNA and Surgeon  Anesthesia Plan Comments: (Patient consented for risks of anesthesia including but not limited to:  - adverse reactions to medications - risk of airway placement if required - damage to eyes, teeth, lips or other oral mucosa - nerve damage due to positioning  - sore throat or hoarseness - Damage to heart, brain, nerves, lungs, other parts of body or loss of life  Patient voiced understanding.)        Anesthesia Quick Evaluation

## 2021-10-10 LAB — SURGICAL PATHOLOGY

## 2022-05-19 ENCOUNTER — Encounter: Payer: Self-pay | Admitting: Internal Medicine

## 2022-05-20 ENCOUNTER — Ambulatory Visit
Admission: RE | Admit: 2022-05-20 | Discharge: 2022-05-20 | Disposition: A | Discharge status: Home / Self Care | Payer: Medicare PPO | Attending: Internal Medicine | Admitting: Internal Medicine

## 2022-05-20 ENCOUNTER — Encounter
Admission: RE | Disposition: A | Discharge status: Home / Self Care | Payer: Self-pay | Source: Home / Self Care | Attending: Internal Medicine

## 2022-05-20 ENCOUNTER — Encounter: Payer: Self-pay | Admitting: Internal Medicine

## 2022-05-20 ENCOUNTER — Ambulatory Visit: Payer: Medicare PPO | Admitting: Anesthesiology

## 2022-05-20 ENCOUNTER — Other Ambulatory Visit: Payer: Self-pay

## 2022-05-20 DIAGNOSIS — Z1211 Encounter for screening for malignant neoplasm of colon: Secondary | ICD-10-CM | POA: Insufficient documentation

## 2022-05-20 DIAGNOSIS — K640 First degree hemorrhoids: Secondary | ICD-10-CM | POA: Diagnosis not present

## 2022-05-20 DIAGNOSIS — E669 Obesity, unspecified: Secondary | ICD-10-CM | POA: Insufficient documentation

## 2022-05-20 DIAGNOSIS — Z6831 Body mass index (BMI) 31.0-31.9, adult: Secondary | ICD-10-CM | POA: Insufficient documentation

## 2022-05-20 DIAGNOSIS — F17210 Nicotine dependence, cigarettes, uncomplicated: Secondary | ICD-10-CM | POA: Diagnosis not present

## 2022-05-20 DIAGNOSIS — K219 Gastro-esophageal reflux disease without esophagitis: Secondary | ICD-10-CM | POA: Diagnosis not present

## 2022-05-20 DIAGNOSIS — Z86010 Personal history of colonic polyps: Secondary | ICD-10-CM | POA: Diagnosis not present

## 2022-05-20 DIAGNOSIS — D120 Benign neoplasm of cecum: Secondary | ICD-10-CM | POA: Diagnosis not present

## 2022-05-20 HISTORY — PX: COLONOSCOPY WITH PROPOFOL: SHX5780

## 2022-05-20 SURGERY — COLONOSCOPY WITH PROPOFOL
Anesthesia: General

## 2022-05-20 MED ORDER — SODIUM CHLORIDE 0.9 % IV SOLN: INTRAVENOUS | Status: DC

## 2022-05-20 MED ORDER — PROPOFOL 500 MG/50ML IV EMUL
INTRAVENOUS | Status: DC | PRN
  Administered 2022-05-20: 120 ug/kg/min via INTRAVENOUS

## 2022-05-20 MED ORDER — PROPOFOL 10 MG/ML IV BOLUS
INTRAVENOUS | Status: DC | PRN
  Administered 2022-05-20: 80 mg via INTRAVENOUS

## 2022-05-20 MED ORDER — METHYLENE BLUE 1 % INJ SOLN
INTRAVENOUS | Status: DC | PRN
  Administered 2022-05-20: 9 mL via SUBMUCOSAL

## 2022-05-20 MED ORDER — LIDOCAINE HCL (CARDIAC) PF 100 MG/5ML IV SOSY
PREFILLED_SYRINGE | INTRAVENOUS | Status: DC | PRN
  Administered 2022-05-20: 60 mg via INTRAVENOUS

## 2022-05-20 MED ORDER — PROPOFOL 10 MG/ML IV BOLUS
INTRAVENOUS | Status: AC
  Filled 2022-05-20: qty 20

## 2022-05-20 NOTE — Interval H&P Note (Signed)
History and Physical Interval Note:  05/20/2022 2:34 PM  Courtney Mills  has presented today for surgery, with the diagnosis of ph colon polyps.  The various methods of treatment have been discussed with the patient and family. After consideration of risks, benefits and other options for treatment, the patient has consented to  Procedure(s): COLONOSCOPY WITH PROPOFOL (N/A) as a surgical intervention.  The patient's history has been reviewed, patient examined, no change in status, stable for surgery.  I have reviewed the patient's chart and labs.  Questions were answered to the patient's satisfaction.     [place], Vander

## 2022-05-20 NOTE — Anesthesia Postprocedure Evaluation (Signed)
Anesthesia Post Note  Patient: Courtney Mills  Procedure(s) Performed: COLONOSCOPY WITH PROPOFOL  Patient location during evaluation: Endoscopy Anesthesia Type: General Level of consciousness: awake and alert Pain management: pain level controlled Vital Signs Assessment: post-procedure vital signs reviewed and stable Respiratory status: spontaneous breathing, nonlabored ventilation, respiratory function stable and patient connected to nasal cannula oxygen Cardiovascular status: blood pressure returned to baseline and stable Postop Assessment: no apparent nausea or vomiting Anesthetic complications: no   No notable events documented.   Last Vitals:  Vitals:   05/20/22 1544 05/20/22 1554  BP: (!) 121/55 107/68  Pulse:    Resp:    Temp: (!) 36.3 C   SpO2:  100%    Last Pain:  Vitals:   05/20/22 1554  TempSrc:   PainSc: 0-No pain                 Precious Haws Caasi Giglia

## 2022-05-20 NOTE — Op Note (Signed)
[facility] Gastroenterology Patient Name: Courtney Mills Procedure Date: 05/20/2022 2:41 PM MRN: [id number] Account #: [id number] Date of Birth: 21-May-1951 Admit Type: Outpatient Age: 71 Room: [facility] ENDO ROOM 2 Gender: Female Note Status: Finalized Instrument Name: Jasper Riling [id number] Procedure:             Colonoscopy Indications:           Surveillance: Incomplete removal of large sessile                         adenoma last colonoscopy (<3 yrs) Providers:             Benay Pike. Alice Reichert MD, MD Referring MD:          [place]:             Propofol per Anesthesia Complications:         No immediate complications. Procedure:             Pre-Anesthesia Assessment:                        - The risks and benefits of the procedure and the                         sedation options and risks were discussed with the                         patient. All questions were answered and informed                         consent was obtained.                        - Patient identification and proposed procedure were                         verified prior to the procedure by the nurse. The                         procedure was verified in the procedure room.                        - ASA Grade Assessment: III - A patient with severe                         systemic disease.                        - After reviewing the risks and benefits, the patient                         was deemed in satisfactory condition to undergo the                         procedure.                        After obtaining informed consent, the colonoscope was                         passed under direct vision. Throughout  the procedure,                         the patient's blood pressure, pulse, and oxygen                         saturations were monitored continuously. The                         Colonoscope was introduced through the anus and                         advanced to the the  cecum, identified by appendiceal                         orifice and ileocecal valve. The colonoscopy was                         performed without difficulty. The patient tolerated                         the procedure well. The quality of the bowel                         preparation was good. The ileocecal valve, appendiceal                         orifice, and rectum were photographed. Findings:      The perianal and digital rectal examinations were normal. Pertinent       negatives include normal sphincter tone and no palpable rectal lesions.      Non-bleeding internal hemorrhoids were found during retroflexion. The       hemorrhoids were Grade I (internal hemorrhoids that do not prolapse).      Many carpet-like polyps were found in the cecum. The polyps were medium       in size. Area was successfully injected with 9 mL Eleview for a lift       polypectomy. These polyps were removed with a piecemeal technique using       a hot snare. Resection and retrieval were complete. Fulguration to       ablate the lesion remnants by argon plasma at 0.8 liters/minute and 20       watts was successful. To prevent bleeding after the polypectomy, four       hemostatic clips were successfully placed (MR conditional). There was no       bleeding during, or at the end, of the procedure.      The exam was otherwise without abnormality. Impression:            - Non-bleeding internal hemorrhoids.                        - Many medium polyps in the cecum, removed piecemeal                         using a hot snare. Resected and retrieved. Injected.                         Treated with argon plasma coagulation (APC). Clips (MR  conditional) were placed.                        - The examination was otherwise normal. Recommendation:        - Patient has a contact number available for                         emergencies. The signs and symptoms of potential                          delayed complications were discussed with the patient.                         Return to normal activities tomorrow. Written                         discharge instructions were provided to the patient.                        - Resume previous diet.                        - Continue present medications.                        - Repeat colonoscopy is recommended for surveillance.                         The colonoscopy date will be determined after                         pathology results from today's exam become available                         for review.                        - Return to GI office PRN.                        - The findings and recommendations were discussed with                         the patient. Procedure Code(s):     --- Professional ---                        [phone], Colonoscopy, flexible; with removal of                         tumor(s), polyp(s), or other lesion(s) by snare                         technique                        [id number], Colonoscopy, flexible; with directed submucosal                         injection(s), any substance Diagnosis Code(s):     --- Professional ---  K64.0, First degree hemorrhoids                        K63.5, Polyp of colon                        Z86.010, Personal history of colonic polyps CPT copyright 2019 [employer]. All rights reserved. The codes documented in this report are preliminary and upon coder review may  be revised to meet current compliance requirements. Efrain Sella MD, MD 05/20/2022 3:49:57 PM This report has been signed electronically. Number of Addenda: 0 Note Initiated On: 05/20/2022 2:41 PM Scope Withdrawal Time: 0 hours 40 minutes 23 seconds  Total Procedure Duration: 0 hours 47 minutes 6 seconds  Estimated Blood Loss:  Estimated blood loss was minimal.      [facility]

## 2022-05-20 NOTE — Transfer of Care (Signed)
Immediate Anesthesia Transfer of Care Note  Patient: Courtney Mills  Procedure(s) Performed: COLONOSCOPY WITH PROPOFOL  Patient Location: PACU  Anesthesia Type:General  Level of Consciousness: drowsy  Airway & Oxygen Therapy: Patient Spontanous Breathing  Post-op Assessment: Report given to RN and Post -op Vital signs reviewed and stable  Post vital signs: Reviewed and stable  Last Vitals:  Vitals Value Taken Time  BP 123/69 1543  Temp 97.8 1543  Pulse 73 05/20/22 1543  Resp 24 05/20/22 1543  SpO2 100 % 05/20/22 1543  Vitals shown include unvalidated device data.  Last Pain:  Vitals:   05/20/22 1330  TempSrc: Temporal  PainSc: 0-No pain         Complications: No notable events documented.

## 2022-05-20 NOTE — H&P (Signed)
Outpatient short stay form Pre-procedure 05/20/2022 2:32 PM Archit Leger K. Alice Reichert, M.D.  Primary Physician: [facility] services  Reason for visit: Personal history of colon polyps  History of present illness:                            Patient presents for colonoscopy for a personal hx of colon polyps. The patient denies abdominal pain, abnormal weight loss or rectal bleeding.      Current Facility-Administered Medications:    0.9 %  sodium chloride infusion, , Intravenous, Continuous, [place], Benay Pike, MD, Last Rate: 20 mL/hr at 05/20/22 1346, New Bag at 05/20/22 1346  Medications Prior to Admission  Medication Sig Dispense Refill Last Dose   ascorbic acid (VITAMIN C) 1000 MG tablet Take 1,000 mg by mouth daily.      celecoxib (CELEBREX) 100 MG capsule Take 100 mg by mouth 2 (two) times daily.      cyclobenzaprine (FLEXERIL) 10 MG tablet Take 1 tablet (10 mg total) by mouth 3 (three) times daily as needed for muscle spasms. 30 tablet 0    traMADol (ULTRAM) 50 MG tablet Take 1 tablet (50 mg total) by mouth every 8 (eight) hours as needed. 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    Physical Exam  Gen: Alert, oriented. Appears stated age.  HEENT: /AT. PERRLA. Lungs: CTA, no wheezes. CV: RR nl S1, S2. Abd: soft, benign, no masses. BS+ Ext: No edema. Pulses 2+    Planned procedures: Proceed with colonoscopy. The patient understands the nature of the planned procedure, indications, risks, alternatives and potential complications including but not limited to bleeding, infection, perforation, damage to internal organs and possible oversedation/side effects  from anesthesia. The patient agrees and gives consent to proceed.  Please refer to procedure notes for findings, recommendations and patient disposition/instructions.     Alexander Aument K. Alice Reichert, M.D. Gastroenterology 05/20/2022  2:32 PM

## 2022-05-20 NOTE — Anesthesia Preprocedure Evaluation (Signed)
Anesthesia Evaluation  Patient identified by MRN, date of birth, ID band Patient awake    Reviewed: Allergy & Precautions, NPO status , Patient's Chart, lab work & pertinent test results  History of Anesthesia Complications Negative for: history of anesthetic complications  Airway Mallampati: I   Neck ROM: Full    Dental   Crown :   Pulmonary Current Smoker (1/2 - 1 ppd)Patient did not abstain from smoking.,    Pulmonary exam normal breath sounds clear to auscultation       Cardiovascular Exercise Tolerance: Good negative cardio ROS Normal cardiovascular exam Rhythm:Regular Rate:Normal     Neuro/Psych negative neurological ROS     GI/Hepatic GERD  ,  Endo/Other  Obesity   Renal/GU negative Renal ROS     Musculoskeletal   Abdominal   Peds  Hematology negative hematology ROS (+)   Anesthesia Other Findings   Reproductive/Obstetrics                             Anesthesia Physical Anesthesia Plan  ASA: 2  Anesthesia Plan: General   Post-op Pain Management:    Induction: Intravenous  PONV Risk Score and Plan: 2 and Propofol infusion, TIVA and Treatment may vary due to age or medical condition  Airway Management Planned: Natural Airway  Additional Equipment:   Intra-op Plan:   Post-operative Plan:   Informed Consent: I have reviewed the patients History and Physical, chart, labs and discussed the procedure including the risks, benefits and alternatives for the proposed anesthesia with the patient or authorized representative who has indicated his/her understanding and acceptance.       Plan Discussed with: CRNA  Anesthesia Plan Comments: (LMA/GETA backup discussed.  Patient consented for risks of anesthesia including but not limited to:  - adverse reactions to medications - damage to eyes, teeth, lips or other oral mucosa - nerve damage due to positioning  - sore throat  or hoarseness - damage to heart, brain, nerves, lungs, other parts of body or loss of life  Informed patient about role of CRNA in peri- and intra-operative care.  Patient voiced understanding.)        Anesthesia Quick Evaluation

## 2022-05-21 ENCOUNTER — Encounter: Payer: Self-pay | Admitting: Internal Medicine

## 2022-05-22 LAB — SURGICAL PATHOLOGY

## 2023-03-22 ENCOUNTER — Other Ambulatory Visit: Payer: Self-pay | Admitting: Family Medicine

## 2023-03-22 DIAGNOSIS — R109 Unspecified abdominal pain: Secondary | ICD-10-CM

## 2023-03-30 ENCOUNTER — Other Ambulatory Visit: Payer: Self-pay | Admitting: Family Medicine

## 2023-03-30 DIAGNOSIS — R1032 Left lower quadrant pain: Secondary | ICD-10-CM

## 2023-03-30 DIAGNOSIS — R109 Unspecified abdominal pain: Secondary | ICD-10-CM

## 2023-03-31 ENCOUNTER — Ambulatory Visit: Payer: Medicare PPO

## 2023-03-31 ENCOUNTER — Ambulatory Visit
Admission: RE | Admit: 2023-03-31 | Discharge: 2023-03-31 | Disposition: A | Discharge status: Home / Self Care | Payer: Medicare PPO | Source: Ambulatory Visit | Attending: Family Medicine | Admitting: Family Medicine

## 2023-03-31 DIAGNOSIS — R1032 Left lower quadrant pain: Secondary | ICD-10-CM | POA: Insufficient documentation

## 2023-03-31 DIAGNOSIS — R109 Unspecified abdominal pain: Secondary | ICD-10-CM | POA: Insufficient documentation

## 2023-04-05 ENCOUNTER — Ambulatory Visit
Admission: RE | Admit: 2023-04-05 | Discharge: 2023-04-05 | Disposition: A | Discharge status: Home / Self Care | Payer: Medicare PPO | Source: Ambulatory Visit | Attending: Gastroenterology | Admitting: Gastroenterology

## 2023-04-05 ENCOUNTER — Other Ambulatory Visit: Payer: Self-pay | Admitting: Gastroenterology

## 2023-04-05 DIAGNOSIS — R1032 Left lower quadrant pain: Secondary | ICD-10-CM | POA: Diagnosis present

## 2023-04-05 DIAGNOSIS — R1031 Right lower quadrant pain: Secondary | ICD-10-CM

## 2023-04-05 MED ORDER — IOHEXOL 300 MG/ML  SOLN
100.0000 mL | Freq: Once | INTRAMUSCULAR | Status: AC | PRN
Start: 2023-04-05 — End: 2023-04-05
  Administered 2023-04-05: 100 mL via INTRAVENOUS

## 2023-07-29 ENCOUNTER — Ambulatory Visit: Payer: BC Managed Care – PPO | Admitting: Nurse Practitioner
